# Patient Record
Sex: Female | Born: 2000 | Race: White | Hispanic: No | Marital: Single | Smoking: Never smoker
Health system: Southern US, Community
[De-identification: ages and names within clinical notes are randomized; demographics above are authoritative.]

## PROBLEM LIST (undated history)

## (undated) DIAGNOSIS — J45909 Unspecified asthma, uncomplicated: Secondary | ICD-10-CM

## (undated) DIAGNOSIS — K219 Gastro-esophageal reflux disease without esophagitis: Secondary | ICD-10-CM

## (undated) DIAGNOSIS — D649 Anemia, unspecified: Secondary | ICD-10-CM

## (undated) DIAGNOSIS — K589 Irritable bowel syndrome without diarrhea: Secondary | ICD-10-CM

## (undated) HISTORY — PX: WISDOM TOOTH EXTRACTION: SHX21

## (undated) HISTORY — PX: CHOLECYSTECTOMY: SHX55

---
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
## 2019-12-21 ENCOUNTER — Telehealth: Payer: Self-pay | Admitting: Advanced Practice Midwife

## 2019-12-21 NOTE — Telephone Encounter (Signed)

## 2019-12-22 ENCOUNTER — Encounter: Payer: Self-pay | Admitting: Advanced Practice Midwife

## 2019-12-22 ENCOUNTER — Other Ambulatory Visit: Payer: Self-pay

## 2019-12-22 ENCOUNTER — Ambulatory Visit: Payer: Managed Care, Other (non HMO) | Admitting: Advanced Practice Midwife

## 2019-12-22 VITALS — BP 137/86 | HR 113 | Ht 60.0 in | Wt 167.0 lb

## 2019-12-22 DIAGNOSIS — Z3009 Encounter for other general counseling and advice on contraception: Secondary | ICD-10-CM

## 2019-12-22 DIAGNOSIS — Z3202 Encounter for pregnancy test, result negative: Secondary | ICD-10-CM | POA: Diagnosis not present

## 2019-12-22 LAB — POCT URINE PREGNANCY: Preg Test, Ur: NEGATIVE

## 2019-12-22 NOTE — Progress Notes (Signed)
**Note Regina-Identified via Obfuscation**    GYN VISIT Patient name: Regina Jordan MRN [id number]  Date of birth: Aug 19, 2001 Chief Complaint:   discuss birth control options (interested in IUD)  History of Present Illness:   Regina Jordan is a 19 y.o. G0P0000 Caucasian female being seen today for discussion re contraception, esp interested in IUD. Unsure which one, but may want to try to conceive in 5 or less years.  Is very faithful with the pill but would like something more effective; on cycle currently and has not missed pills.  Patient's last menstrual period was 12/19/2019. The current method of family planning is OCP (estrogen/progesterone).  Last pap <20yo.  Review of Systems:   Pertinent items are noted in HPI Denies fever/chills, dizziness, headaches, visual disturbances, fatigue, shortness of breath, chest pain, abdominal pain, vomiting, abnormal vaginal discharge/itching/odor/irritation, problems with periods, bowel movements, urination, or intercourse unless otherwise stated above.  Pertinent History Reviewed:  Reviewed past medical,surgical, social, obstetrical and family history.  Reviewed problem list, medications and allergies. Physical Assessment:   Vitals:   12/22/19 1340  BP: 137/86  Pulse: (!) 113  Weight: 167 lb (75.8 kg)  Height: 5' (1.524 m)  Body mass index is 32.61 kg/m.       Physical Examination:   General appearance: alert, well appearing, and in no distress  Mental status: alert, oriented to person, place, and time  Skin: warm & dry   Cardiovascular: normal heart rate noted  Respiratory: normal respiratory effort, no distress  Abdomen: soft, non-tender   Pelvic: examination not indicated  Extremities: no edema    Results for orders placed or performed in visit on 12/22/19 (from the past 24 hour(s))  POCT urine pregnancy   Collection Time: 12/22/19  1:48 PM  Result Value Ref Range   Preg Test, Ur Negative Negative    Assessment & Plan:  1) Desires IUD placement> is on cycle and has  been taking pills regularly; debating between [place]- information given   Meds: No orders of the defined types were placed in this encounter.   Orders Placed This Encounter  Procedures  . POCT urine pregnancy    Return for tomorrow, IUD insertion.  Arabella Merles CNM 12/22/2019 2:15 PM

## 2019-12-22 NOTE — Patient Instructions (Signed)

## 2019-12-23 ENCOUNTER — Encounter: Payer: Self-pay | Admitting: Advanced Practice Midwife

## 2019-12-23 ENCOUNTER — Ambulatory Visit (INDEPENDENT_AMBULATORY_CARE_PROVIDER_SITE_OTHER): Payer: Managed Care, Other (non HMO) | Admitting: Advanced Practice Midwife

## 2019-12-23 ENCOUNTER — Other Ambulatory Visit: Payer: Self-pay

## 2019-12-23 VITALS — BP 122/85 | HR 97 | Ht 60.0 in | Wt 166.0 lb

## 2019-12-23 DIAGNOSIS — Z30430 Encounter for insertion of intrauterine contraceptive device: Secondary | ICD-10-CM

## 2019-12-23 DIAGNOSIS — Z3202 Encounter for pregnancy test, result negative: Secondary | ICD-10-CM | POA: Diagnosis not present

## 2019-12-23 MED ORDER — LEVONORGESTREL 20 MCG/24HR IU IUD: INTRAUTERINE_SYSTEM | Freq: Once | INTRAUTERINE | Status: AC

## 2019-12-23 NOTE — Addendum Note (Signed)
Addended by: Colen Darling on: 12/23/2019 04:35 PM   Modules accepted: Orders

## 2019-12-23 NOTE — Progress Notes (Signed)
Regina Jordan is a 19 y.o. year old  female   who presents for placement of a Mirena IUD. Her LMP was now (on COCs) and her pregnancy test today is negative.    The risks and benefits of the method and placement have been thouroughly reviewed with the patient and all questions were answered.  Specifically the patient is aware of failure rate of 10/998, expulsion of the IUD and of possible perforation.  The patient is aware of irregular bleeding due to the method and understands the incidence of irregular bleeding diminishes with time.  Time out was performed.  A Graves speculum was placed.  The cervix was prepped using Betadine. The uterus was found to be neutral,sl retroverted and it sounded to 7 cm.  The cervix was grasped with a tenaculum and the IUD was inserted to 7 cm.  It was pulled back 1 cm and the IUD was disengaged.  The strings were trimmed to 3 cm.  Sonogram was performed and the proper placement of the IUD was verified.  The patient was instructed on signs and symptoms of infection and to check for the strings after each menses or each month.  The patient is to refrain from intercourse for 3 days.  The patient is scheduled for a return appointment after her first menses or 4 weeks.  [name] 12/23/2019 4:05 PM

## 2019-12-23 NOTE — Patient Instructions (Signed)
IUD PLACEMENT POST-PROCEDURE INSTRUCTIONS  1. You may take Ibuprofen, Aleve or Tylenol for pain if needed.  Cramping should resolve within in 24 hours.  2. You may have a small amount of spotting.  You should wear a mini pad for the next few days  3.    Nothing in the vagina for 3 days (tampons or intercourse)  4.  You need to call if you have a fever (more than 100.4), any moderate to severe pelvic pain, fever, or foul smelling vaginal discharge.  Irregular bleeding is common the first several months after having an IUD placed. You do not need to call for this reason unless you are concerned.  5. Shower or bathe as normal  6.  You should have a follow-up appointment in 4-8 weeks for a re-check to make sure you are not having any problems.  

## 2020-01-19 ENCOUNTER — Ambulatory Visit (INDEPENDENT_AMBULATORY_CARE_PROVIDER_SITE_OTHER): Payer: Managed Care, Other (non HMO) | Admitting: Advanced Practice Midwife

## 2020-01-19 ENCOUNTER — Other Ambulatory Visit: Payer: Self-pay

## 2020-01-19 VITALS — Ht 60.0 in | Wt 169.0 lb

## 2020-01-19 DIAGNOSIS — Z30431 Encounter for routine checking of intrauterine contraceptive device: Secondary | ICD-10-CM

## 2020-01-19 MED ORDER — IBUPROFEN 800 MG PO TABS: 800.0000 mg | ORAL_TABLET | Freq: Three times a day (TID) | ORAL | 2 refills | Status: AC

## 2020-01-19 NOTE — Progress Notes (Signed)
   GYN VISIT Patient name: Regina Jordan MRN [id number]  Date of birth: [birth year] Chief Complaint:   Follow-up (IUD Check)  History of Present Illness:   Regina Jordan is a 19 y.o. G0P0000 Caucasian female being seen today for f/u of IUD. She had a Mirena placed on March 18th and reports having intermittent sharp discomfort as well as sharp pain once during sex (and had to stop) and then once after sex. Reports pink d/c but no bleeding since placement.    Depression screen [facility] 2/9 12/22/2019  Decreased Interest 0  Down, Depressed, Hopeless 0  PHQ - 2 Score 0    No LMP recorded. (Menstrual status: IUD). The current method of family planning is IUD.  Last pap <20yo.  Review of Systems:   Pertinent items are noted in HPI Denies fever/chills, dizziness, headaches, visual disturbances, fatigue, shortness of breath, chest pain, abdominal pain, vomiting, abnormal vaginal discharge/itching/odor/irritation, problems with periods, bowel movements, urination, or intercourse unless otherwise stated above.  Pertinent History Reviewed:  Reviewed past medical,surgical, social, obstetrical and family history.  Reviewed problem list, medications and allergies. Physical Assessment:   Vitals:   01/19/20 1428  Weight: 169 lb (76.7 kg)  Height: 5' (1.524 m)  Body mass index is 33.01 kg/m.       Physical Examination:   General appearance: alert, well appearing, and in no distress  Mental status: alert, oriented to person, place, and time  Skin: warm & dry   Cardiovascular: normal heart rate noted  Respiratory: normal respiratory effort, no distress  Abdomen: soft, non-tender   Pelvic: normal external genitalia, vulva, vagina, cervix, uterus and adnexa; string visualized approx 3cm from os; bedside u/s confirmed position of IUD in fundus  Extremities: no edema   Chaperone: Amanda Rash    No results found for this or any previous visit (from the past 24 hour(s)).  Assessment & Plan:  1) Mirena  IUD f/u> having intermittent sharp discomfort- wanted to ensure IUD was still in place; will try continuous Motrin for one week; discussed various positions for intercourse that may help with discomfort   Meds:  Meds ordered this encounter  Medications  . ibuprofen (ADVIL) 800 MG tablet    Sig: Take 1 tablet (800 mg total) by mouth every 8 (eight) hours for 7 days.    Dispense:  24 tablet    Refill:  2    Order Specific Question:   Supervising Provider    Answer:   Tilda Burrow [[id number]]    No orders of the defined types were placed in this encounter.   Return for prn.  Arabella Merles CNM 01/19/2020 3:00 PM

## 2020-01-20 ENCOUNTER — Ambulatory Visit: Payer: Managed Care, Other (non HMO) | Admitting: Advanced Practice Midwife

## 2020-11-10 ENCOUNTER — Ambulatory Visit: Payer: Managed Care, Other (non HMO) | Admitting: Women's Health

## 2020-11-17 ENCOUNTER — Ambulatory Visit: Payer: Self-pay | Admitting: Surgery

## 2020-11-17 NOTE — H&P (View-Only) (Signed)
Surgical Evaluation  Chief Complaint: multiple GI issues  HPI: this is a previously healthy 20 year old woman who has been experiencing significant GI issues since about November of last year.  She endorses postprandial right upper quadrant pain, intermittent nausea and emesis, as well as alternating diarrhea and constipation.  She also has a long-standing history of acid reflux for the last 3 or 4 years refractory to multiple medications. She has lost 30 pounds in the last few months due to the symptoms. She has had lab workearlier this week which was unremarkable including thyroid studies, lipase, hypertensive metabolic panel, CBC, UA, She underwent an ultrasound that demonstrated a 2 cm gallstone.    Denies any family history of inflammatory bowel disease or pancreatitis although several family members have had gallbladder disease.  No Known Allergies  Past Medical History:  Diagnosis Date  . Asthma     No past surgical history on file.  Family History  Problem Relation Age of Onset  . Hypertension Paternal Grandfather   . Hypertension Paternal Grandmother   . Thyroid disease Paternal Grandmother   . Other Maternal Grandmother        fatty liver  . Hypertension Father   . High Cholesterol Father   . ADD / ADHD Father   . Asthma Father   . ADD / ADHD Sister   . ADD / ADHD Sister     Social History   Socioeconomic History  . Marital status: Single    Spouse name: Not on file  . Number of children: Not on file  . Years of education: Not on file  . Highest education level: Not on file  Occupational History  . Not on file  Tobacco Use  . Smoking status: Never Smoker  . Smokeless tobacco: Never Used  Vaping Use  . Vaping Use: Never used  Substance and Sexual Activity  . Alcohol use: Not Currently  . Drug use: Never  . Sexual activity: Yes    Birth control/protection: Pill  Other Topics Concern  . Not on file  Social History Narrative  . Not on file   Social  Determinants of Health   Financial Resource Strain: Not on file  Food Insecurity: Not on file  Transportation Needs: Not on file  Physical Activity: Not on file  Stress: Not on file  Social Connections: Not on file    Current Outpatient Medications on File Prior to Visit  Medication Sig Dispense Refill  . albuterol (VENTOLIN HFA) 108 (90 Base) MCG/ACT inhaler Inhale into the lungs.    . cetirizine (ZYRTEC) 10 MG tablet Take by mouth.    . diphenhydrAMINE (BENADRYL) 25 MG tablet Take 1 tablet (25 mg total) by mouth every 8 (eight) hours as needed for itching or allergies. 20 tablet 0  . Norgestimate-Ethinyl Estradiol Triphasic 0.18/0.215/0.25 MG-35 MCG tablet TAKE 1 TABLET BY MOUTH DAILY-needs 3 packs per visit    . omeprazole (PRILOSEC) 40 MG capsule Take by mouth.    . triamcinolone ointment (KENALOG) 0.1 % ataa bid     No current facility-administered medications on file prior to visit.    Review of Systems: a complete, 10pt review of systems was completed with pertinent positives and negatives as documented in the HPI  Physical Exam: Vitals Weight: 139.5 lb Height: 62in Body Surface Area: 1.64 m Body Mass Index: 25.51 kg/m  Temp.: 98.48F  Pulse: 72 (Regular)  P.OX: 99% (Room air) BP: 120/60(Sitting, Left Arm, Standard)  She is alert and well-appearing Unlabored respirations Abdomen  is soft, nondistended, mildly tender in the right subcostal region and epigastrium, no hernia, no mass, no organomegaly  CBC Latest Ref Rng & Units 02/12/2017  WBC 4.5 - 13.5 K/uL 19.6(H)  Hemoglobin 12.0 - 16.0 g/dL 13.8  Hematocrit 36.0 - 49.0 % 41.6  Platelets 150 - 400 K/uL 324    CMP Latest Ref Rng & Units 02/12/2017  Glucose 65 - 99 mg/dL 81  BUN 6 - 20 mg/dL 6  Creatinine 0.50 - 1.00 mg/dL 0.83  Sodium 135 - 145 mmol/L 138  Potassium 3.5 - 5.1 mmol/L 3.2(L)  Chloride 101 - 111 mmol/L 104  CO2 22 - 32 mmol/L 22  Calcium 8.9 - 10.3 mg/dL 9.4  Total Protein 6.5 - 8.1 g/dL  8.0  Total Bilirubin 0.3 - 1.2 mg/dL 0.8  Alkaline Phos 47 - 119 U/L 72  AST 15 - 41 U/L 15  ALT 14 - 54 U/L 8(L)    No results found for: INR, PROTIME  Imaging: No results found.   A/P: biliary colic, multiple GI complaints. I recommend proceeding with laparoscopic/robotic cholecystectomy with possible cholangiogram. Discussedthe relevant anatomy using a diagram to demonstrate, as well as the technique and risks of surgery including bleeding, pain, scarring, intraabdominal injury specifically to the common bile duct and sequelae, bile leak, conversion to open surgery, blood clot, pneumonia, heart attack, stroke, failure to resolve symptoms, etc. Questions welcomed and answered. Also advised dietary modification to attempt a more bland, unprocessed diet (reduce restaurant foods and processed foods) and increasing water intake to gauge side effect of this on her bowel movements and reflux. If she has ongoing GI symptoms despite cholecystectomy and dietary modification, she will benefit from evaluation by gastroenterologist. She wishes to pursue gallbladder surgery.     Klaudia Beirne, MD [facility]  See AMION to contact appropriate on-call provider   

## 2020-11-17 NOTE — H&P (Signed)
Surgical Evaluation  Chief Complaint: multiple GI issues  HPI: this is a previously healthy 20 year old woman who has been experiencing significant GI issues since about November of last year.  She endorses postprandial right upper quadrant pain, intermittent nausea and emesis, as well as alternating diarrhea and constipation.  She also has a long-standing history of acid reflux for the last 3 or 4 years refractory to multiple medications. She has lost 30 pounds in the last few months due to the symptoms. She has had lab workearlier this week which was unremarkable including thyroid studies, lipase, hypertensive metabolic panel, CBC, UA, She underwent an ultrasound that demonstrated a 2 cm gallstone.    Denies any family history of inflammatory bowel disease or pancreatitis although several family members have had gallbladder disease.  No Known Allergies  Past Medical History:  Diagnosis Date  . Asthma     No past surgical history on file.  Family History  Problem Relation Age of Onset  . Hypertension Paternal Grandfather   . Hypertension Paternal Grandmother   . Thyroid disease Paternal Grandmother   . Other Maternal Grandmother        fatty liver  . Hypertension Father   . High Cholesterol Father   . ADD / ADHD Father   . Asthma Father   . ADD / ADHD Sister   . ADD / ADHD Sister     Social History   Socioeconomic History  . Marital status: Single    Spouse name: Not on file  . Number of children: Not on file  . Years of education: Not on file  . Highest education level: Not on file  Occupational History  . Not on file  Tobacco Use  . Smoking status: Never Smoker  . Smokeless tobacco: Never Used  Vaping Use  . Vaping Use: Never used  Substance and Sexual Activity  . Alcohol use: Not Currently  . Drug use: Never  . Sexual activity: Yes    Birth control/protection: Pill  Other Topics Concern  . Not on file  Social History Narrative  . Not on file   Social  Determinants of Health   Financial Resource Strain: Not on file  Food Insecurity: Not on file  Transportation Needs: Not on file  Physical Activity: Not on file  Stress: Not on file  Social Connections: Not on file    Current Outpatient Medications on File Prior to Visit  Medication Sig Dispense Refill  . albuterol (VENTOLIN HFA) 108 (90 Base) MCG/ACT inhaler Inhale into the lungs.    . cetirizine (ZYRTEC) 10 MG tablet Take by mouth.    . diphenhydrAMINE (BENADRYL) 25 MG tablet Take 1 tablet (25 mg total) by mouth every 8 (eight) hours as needed for itching or allergies. 20 tablet 0  . Norgestimate-Ethinyl Estradiol Triphasic 0.18/0.215/0.25 MG-35 MCG tablet TAKE 1 TABLET BY MOUTH DAILY-needs 3 packs per visit    . omeprazole (PRILOSEC) 40 MG capsule Take by mouth.    . triamcinolone ointment (KENALOG) 0.1 % ataa bid     No current facility-administered medications on file prior to visit.    Review of Systems: a complete, 10pt review of systems was completed with pertinent positives and negatives as documented in the HPI  Physical Exam: Vitals Weight: 139.5 lb Height: 62in Body Surface Area: 1.64 m Body Mass Index: 25.51 kg/m  Temp.: 98.48F  Pulse: 72 (Regular)  P.OX: 99% (Room air) BP: 120/60(Sitting, Left Arm, Standard)  She is alert and well-appearing Unlabored respirations Abdomen  is soft, nondistended, mildly tender in the right subcostal region and epigastrium, no hernia, no mass, no organomegaly  CBC Latest Ref Rng & Units 02/12/2017  WBC 4.5 - 13.5 K/uL 19.6(H)  Hemoglobin 12.0 - 16.0 g/dL 68.1  Hematocrit 27.5 - 49.0 % 41.6  Platelets 150 - 400 K/uL 324    CMP Latest Ref Rng & Units 02/12/2017  Glucose 65 - 99 mg/dL 81  BUN 6 - 20 mg/dL 6  Creatinine 1.70 - 0.17 mg/dL 4.94  Sodium 496 - 759 mmol/L 138  Potassium 3.5 - 5.1 mmol/L 3.2(L)  Chloride 101 - 111 mmol/L 104  CO2 22 - 32 mmol/L 22  Calcium 8.9 - 10.3 mg/dL 9.4  Total Protein 6.5 - 8.1 g/dL  8.0  Total Bilirubin 0.3 - 1.2 mg/dL 0.8  Alkaline Phos 47 - 119 U/L 72  AST 15 - 41 U/L 15  ALT 14 - 54 U/L 8(L)    No results found for: INR, PROTIME  Imaging: No results found.   A/P: biliary colic, multiple GI complaints. I recommend proceeding with laparoscopic/robotic cholecystectomy with possible cholangiogram. Discussedthe relevant anatomy using a diagram to demonstrate, as well as the technique and risks of surgery including bleeding, pain, scarring, intraabdominal injury specifically to the common bile duct and sequelae, bile leak, conversion to open surgery, blood clot, pneumonia, heart attack, stroke, failure to resolve symptoms, etc. Questions welcomed and answered. Also advised dietary modification to attempt a more bland, unprocessed diet (reduce restaurant foods and processed foods) and increasing water intake to gauge side effect of this on her bowel movements and reflux. If she has ongoing GI symptoms despite cholecystectomy and dietary modification, she will benefit from evaluation by gastroenterologist. She wishes to pursue gallbladder surgery.     Phylliss Blakes, MD [facility] [place]  See AMION to contact appropriate on-call provider

## 2020-11-23 ENCOUNTER — Encounter (HOSPITAL_COMMUNITY): Payer: Self-pay

## 2020-11-23 NOTE — Progress Notes (Signed)
DUE TO COVID-19 ONLY ONE VISITOR IS ALLOWED TO COME WITH YOU AND STAY IN THE WAITING ROOM ONLY DURING PRE OP AND PROCEDURE DAY OF SURGERY. THE 1 VISITOR  MAY VISIT WITH YOU AFTER SURGERY IN YOUR PRIVATE ROOM DURING VISITING HOURS ONLY!  YOU NEED TO HAVE A COVID 19 TEST ON__2/25/2022 ___ @_______ , THIS TEST MUST BE DONE BEFORE SURGERY,  COVID TESTING SITE [address] [place] , IT IS ON THE RIGHT GOING OUT [address] APPROXIMATELY  2 MINUTES PAST [employer] ON THE RIGHT. ONCE YOUR COVID TEST IS COMPLETED,  PLEASE BEGIN THE QUARANTINE INSTRUCTIONS AS OUTLINED IN YOUR HANDOUT.                Regina Jordan  11/23/2020   Your procedure is scheduled on: 12/05/2020   Report to [facility] Main  Entrance   Report to admitting at    1115AM      Call this number if you have problems the morning of surgery [phone]    Remember: Do not eat food , candy gum or mints :After Midnight. You may have clear liquids from midnight until     CLEAR LIQUID DIET   Foods Allowed                                                                       Coffee and tea, regular and decaf                              Plain Jell-O any favor except red or purple                                            Fruit ices (not with fruit pulp)                                      Iced Popsicles                                     Carbonated beverages, regular and diet                                    Cranberry, grape and apple juices Sports drinks like Gatorade Lightly seasoned clear broth or consume(fat free) Sugar, honey syrup   _____________________________________________________________________    BRUSH YOUR TEETH MORNING OF SURGERY AND RINSE YOUR MOUTH OUT, NO CHEWING GUM CANDY OR MINTS.     Take these medicines the morning of surgery with A SIP OF WATER:  iNHALERS AS USUAL AND BRING, PRILOSEC  DO NOT TAKE ANY DIABETIC MEDICATIONS DAY OF YOUR SURGERY                                You may not have any  metal on your body including hair pins and              piercings  Do not wear jewelry, make-up, lotions, powders or perfumes, deodorant             Do not wear nail polish on your fingernails.  Do not shave  48 hours prior to surgery.              Men may shave face and neck.   Do not bring valuables to the hospital. [place].  Contacts, dentures or bridgework may not be worn into surgery.  Leave suitcase in the car. After surgery it may be brought to your room.     Patients discharged the day of surgery will not be allowed to drive home. IF YOU ARE HAVING SURGERY AND GOING HOME THE SAME DAY, YOU MUST HAVE AN ADULT TO DRIVE YOU HOME AND BE WITH YOU FOR 24 HOURS. YOU MAY GO HOME BY TAXI OR UBER OR ORTHERWISE, BUT AN ADULT MUST ACCOMPANY YOU HOME AND STAY WITH YOU FOR 24 HOURS.  Name and phone number of your driver:  Special Instructions: N/A              Please read over the following fact sheets you were given: _____________________________________________________________________  [facility] - Preparing for Surgery Before surgery, you can play an important role.  Because skin is not sterile, your skin needs to be as free of germs as possible.  You can reduce the number of germs on your skin by washing with CHG (chlorahexidine gluconate) soap before surgery.  CHG is an antiseptic cleaner which kills germs and bonds with the skin to continue killing germs even after washing. Please DO NOT use if you have an allergy to CHG or antibacterial soaps.  If your skin becomes reddened/irritated stop using the CHG and inform your nurse when you arrive at Short Stay. Do not shave (including legs and underarms) for at least 48 hours prior to the first CHG shower.  You may shave your face/neck. Please follow these instructions carefully:  1.  Shower with CHG Soap the night before surgery and the  morning of  Surgery.  2.  If you choose to wash your hair, wash your hair first as usual with your  normal  shampoo.  3.  After you shampoo, rinse your hair and body thoroughly to remove the  shampoo.                           4.  Use CHG as you would any other liquid soap.  You can apply chg directly  to the skin and wash                       Gently with a scrungie or clean washcloth.  5.  Apply the CHG Soap to your body ONLY FROM THE NECK DOWN.   Do not use on face/ open                           Wound or open sores. Avoid contact with eyes, ears mouth and genitals (private parts).                       Wash face,  Genitals (private parts) with your normal soap.             6.  Wash thoroughly, paying special attention to the area where your surgery  will be performed.  7.  Thoroughly rinse your body with warm water from the neck down.  8.  DO NOT shower/wash with your normal soap after using and rinsing off  the CHG Soap.                9.  Pat yourself dry with a clean towel.            10.  Wear clean pajamas.            11.  Place clean sheets on your bed the night of your first shower and do not  sleep with pets. Day of Surgery : Do not apply any lotions/deodorants the morning of surgery.  Please wear clean clothes to the hospital/surgery center.  FAILURE TO FOLLOW THESE INSTRUCTIONS MAY RESULT IN THE CANCELLATION OF YOUR SURGERY PATIENT SIGNATURE_________________________________  NURSE SIGNATURE__________________________________  ________________________________________________________________________

## 2020-11-28 ENCOUNTER — Other Ambulatory Visit: Payer: Self-pay

## 2020-11-28 ENCOUNTER — Encounter (INDEPENDENT_AMBULATORY_CARE_PROVIDER_SITE_OTHER): Payer: Self-pay

## 2020-11-28 ENCOUNTER — Encounter (HOSPITAL_COMMUNITY)
Admission: RE | Admit: 2020-11-28 | Discharge: 2020-11-28 | Disposition: A | Discharge status: Home / Self Care | Payer: 59 | Source: Ambulatory Visit | Attending: Surgery | Admitting: Surgery

## 2020-11-28 ENCOUNTER — Encounter (HOSPITAL_COMMUNITY): Payer: Self-pay

## 2020-11-28 DIAGNOSIS — Z01812 Encounter for preprocedural laboratory examination: Secondary | ICD-10-CM | POA: Diagnosis not present

## 2020-11-28 HISTORY — DX: Gastro-esophageal reflux disease without esophagitis: K21.9

## 2020-11-28 HISTORY — DX: Anemia, unspecified: D64.9

## 2020-11-28 LAB — CBC
HCT: 40.5 % (ref 36.0–46.0)
Hemoglobin: 13.3 g/dL (ref 12.0–15.0)
MCH: 30.4 pg (ref 26.0–34.0)
MCHC: 32.8 g/dL (ref 30.0–36.0)
MCV: 92.7 fL (ref 80.0–100.0)
Platelets: 311 10*3/uL (ref 150–400)
RBC: 4.37 MIL/uL (ref 3.87–5.11)
RDW: 12.2 % (ref 11.5–15.5)
WBC: 7.8 10*3/uL (ref 4.0–10.5)
nRBC: 0 % (ref 0.0–0.2)

## 2020-12-02 ENCOUNTER — Other Ambulatory Visit (HOSPITAL_COMMUNITY): Payer: 59

## 2020-12-04 MED ORDER — BUPIVACAINE LIPOSOME 1.3 % IJ SUSP
20.0000 mL | Freq: Once | INTRAMUSCULAR | Status: DC
  Filled 2020-12-04: qty 20

## 2020-12-05 ENCOUNTER — Ambulatory Visit (HOSPITAL_COMMUNITY): Payer: 59 | Admitting: Certified Registered Nurse Anesthetist

## 2020-12-05 ENCOUNTER — Encounter (HOSPITAL_COMMUNITY): Payer: Self-pay | Admitting: Surgery

## 2020-12-05 ENCOUNTER — Observation Stay (HOSPITAL_COMMUNITY)
Admission: RE | Admit: 2020-12-05 | Discharge: 2020-12-06 | Disposition: A | Discharge status: Home / Self Care | Payer: 59 | Source: Ambulatory Visit | Attending: Surgery | Admitting: Surgery

## 2020-12-05 ENCOUNTER — Encounter (HOSPITAL_COMMUNITY)
Admission: RE | Disposition: A | Discharge status: Home / Self Care | Payer: Self-pay | Source: Ambulatory Visit | Attending: Surgery

## 2020-12-05 ENCOUNTER — Other Ambulatory Visit: Payer: Self-pay

## 2020-12-05 DIAGNOSIS — R112 Nausea with vomiting, unspecified: Secondary | ICD-10-CM

## 2020-12-05 DIAGNOSIS — J45909 Unspecified asthma, uncomplicated: Secondary | ICD-10-CM | POA: Insufficient documentation

## 2020-12-05 DIAGNOSIS — K8050 Calculus of bile duct without cholangitis or cholecystitis without obstruction: Principal | ICD-10-CM | POA: Insufficient documentation

## 2020-12-05 DIAGNOSIS — Z9049 Acquired absence of other specified parts of digestive tract: Secondary | ICD-10-CM

## 2020-12-05 DIAGNOSIS — Z9889 Other specified postprocedural states: Secondary | ICD-10-CM

## 2020-12-05 DIAGNOSIS — R1011 Right upper quadrant pain: Secondary | ICD-10-CM | POA: Diagnosis present

## 2020-12-05 HISTORY — DX: Nausea with vomiting, unspecified: R11.2

## 2020-12-05 HISTORY — DX: Other specified postprocedural states: Z98.890

## 2020-12-05 LAB — PREGNANCY, URINE: Preg Test, Ur: NEGATIVE

## 2020-12-05 SURGERY — CHOLECYSTECTOMY, ROBOT-ASSISTED, LAPAROSCOPIC
Anesthesia: General | Site: Abdomen

## 2020-12-05 MED ORDER — SODIUM CHLORIDE 0.9% FLUSH: 3.0000 mL | INTRAVENOUS | Status: DC | PRN

## 2020-12-05 MED ORDER — METOCLOPRAMIDE HCL 5 MG/ML IJ SOLN
INTRAMUSCULAR | Status: AC
  Filled 2020-12-05: qty 2

## 2020-12-05 MED ORDER — ORAL CARE MOUTH RINSE: 15.0000 mL | Freq: Once | OROMUCOSAL | Status: DC

## 2020-12-05 MED ORDER — AMISULPRIDE (ANTIEMETIC) 5 MG/2ML IV SOLN
10.0000 mg | Freq: Once | INTRAVENOUS | Status: AC
  Administered 2020-12-05: 10 mg via INTRAVENOUS

## 2020-12-05 MED ORDER — FENTANYL CITRATE (PF) 100 MCG/2ML IJ SOLN
INTRAMUSCULAR | Status: DC | PRN
  Administered 2020-12-05 (×4): 50 ug via INTRAVENOUS

## 2020-12-05 MED ORDER — LACTATED RINGERS IV SOLN: INTRAVENOUS | Status: DC

## 2020-12-05 MED ORDER — TRAMADOL HCL 50 MG PO TABS: 50.0000 mg | ORAL_TABLET | Freq: Four times a day (QID) | ORAL | 0 refills | Status: AC | PRN

## 2020-12-05 MED ORDER — FENTANYL CITRATE (PF) 100 MCG/2ML IJ SOLN
INTRAMUSCULAR | Status: AC
  Filled 2020-12-05: qty 2

## 2020-12-05 MED ORDER — SIMETHICONE 80 MG PO CHEW
40.0000 mg | CHEWABLE_TABLET | Freq: Four times a day (QID) | ORAL | Status: DC | PRN
  Filled 2020-12-05: qty 1

## 2020-12-05 MED ORDER — CHLORHEXIDINE GLUCONATE 0.12 % MT SOLN: 15.0000 mL | Freq: Once | OROMUCOSAL | Status: DC

## 2020-12-05 MED ORDER — CEFAZOLIN SODIUM-DEXTROSE 2-4 GM/100ML-% IV SOLN
2.0000 g | INTRAVENOUS | Status: AC
  Administered 2020-12-05: 2 g via INTRAVENOUS
  Filled 2020-12-05: qty 100

## 2020-12-05 MED ORDER — ACETAMINOPHEN 325 MG PO TABS: 325.0000 mg | ORAL_TABLET | Freq: Once | ORAL | Status: DC | PRN

## 2020-12-05 MED ORDER — CHLORHEXIDINE GLUCONATE 4 % EX LIQD: 60.0000 mL | Freq: Once | CUTANEOUS | Status: DC

## 2020-12-05 MED ORDER — BUPIVACAINE LIPOSOME 1.3 % IJ SUSP
INTRAMUSCULAR | Status: DC | PRN
  Administered 2020-12-05: 20 mL

## 2020-12-05 MED ORDER — METOCLOPRAMIDE HCL 5 MG/ML IJ SOLN
10.0000 mg | Freq: Once | INTRAMUSCULAR | Status: AC
  Administered 2020-12-05: 10 mg via INTRAVENOUS

## 2020-12-05 MED ORDER — LIDOCAINE 2% (20 MG/ML) 5 ML SYRINGE
INTRAMUSCULAR | Status: DC | PRN
  Administered 2020-12-05: 40 mg via INTRAVENOUS

## 2020-12-05 MED ORDER — MIDAZOLAM HCL 2 MG/2ML IJ SOLN
INTRAMUSCULAR | Status: AC
  Filled 2020-12-05: qty 2

## 2020-12-05 MED ORDER — BUPIVACAINE-EPINEPHRINE 0.25% -1:200000 IJ SOLN
INTRAMUSCULAR | Status: DC | PRN
  Administered 2020-12-05: 30 mL

## 2020-12-05 MED ORDER — MORPHINE SULFATE (PF) 4 MG/ML IV SOLN
1.0000 mg | INTRAVENOUS | Status: DC | PRN
Start: 2020-12-05 — End: 2020-12-06
  Administered 2020-12-06 (×2): 1 mg via INTRAVENOUS
  Filled 2020-12-05 (×2): qty 1

## 2020-12-05 MED ORDER — OXYCODONE HCL 5 MG PO TABS: 5.0000 mg | ORAL_TABLET | ORAL | Status: DC | PRN

## 2020-12-05 MED ORDER — ENOXAPARIN SODIUM 40 MG/0.4ML ~~LOC~~ SOLN: 40.0000 mg | SUBCUTANEOUS | Status: DC

## 2020-12-05 MED ORDER — AMISULPRIDE (ANTIEMETIC) 5 MG/2ML IV SOLN
INTRAVENOUS | Status: AC
  Filled 2020-12-05: qty 2

## 2020-12-05 MED ORDER — ONDANSETRON HCL 4 MG/2ML IJ SOLN
INTRAMUSCULAR | Status: AC
  Filled 2020-12-05: qty 2

## 2020-12-05 MED ORDER — GABAPENTIN 300 MG PO CAPS
300.0000 mg | ORAL_CAPSULE | ORAL | Status: AC
  Administered 2020-12-05: 300 mg via ORAL
  Filled 2020-12-05: qty 1

## 2020-12-05 MED ORDER — FENTANYL CITRATE (PF) 100 MCG/2ML IJ SOLN: 25.0000 ug | INTRAMUSCULAR | Status: DC | PRN

## 2020-12-05 MED ORDER — SCOPOLAMINE 1 MG/3DAYS TD PT72
MEDICATED_PATCH | TRANSDERMAL | Status: DC | PRN
  Administered 2020-12-05: 1 via TRANSDERMAL

## 2020-12-05 MED ORDER — DEXMEDETOMIDINE (PRECEDEX) IN NS 20 MCG/5ML (4 MCG/ML) IV SYRINGE
PREFILLED_SYRINGE | INTRAVENOUS | Status: DC | PRN
  Administered 2020-12-05 (×2): 4 ug via INTRAVENOUS

## 2020-12-05 MED ORDER — CHLORHEXIDINE GLUCONATE 0.12 % MT SOLN
15.0000 mL | Freq: Once | OROMUCOSAL | Status: AC
  Administered 2020-12-05: 12:00:00 15 mL via OROMUCOSAL

## 2020-12-05 MED ORDER — SODIUM CHLORIDE 0.9 % IV SOLN: 250.0000 mL | INTRAVENOUS | Status: DC | PRN

## 2020-12-05 MED ORDER — ROCURONIUM BROMIDE 10 MG/ML (PF) SYRINGE
PREFILLED_SYRINGE | INTRAVENOUS | Status: AC
  Filled 2020-12-05: qty 10

## 2020-12-05 MED ORDER — ONDANSETRON HCL 4 MG/2ML IJ SOLN
4.0000 mg | Freq: Four times a day (QID) | INTRAMUSCULAR | Status: DC | PRN
  Administered 2020-12-06 (×2): 4 mg via INTRAVENOUS
  Filled 2020-12-05 (×2): qty 2

## 2020-12-05 MED ORDER — BOOST / RESOURCE BREEZE PO LIQD CUSTOM
1.0000 | Freq: Three times a day (TID) | ORAL | Status: DC
  Administered 2020-12-06: 09:00:00 1 via ORAL

## 2020-12-05 MED ORDER — LIDOCAINE HCL (PF) 2 % IJ SOLN
INTRAMUSCULAR | Status: AC
  Filled 2020-12-05: qty 5

## 2020-12-05 MED ORDER — PROPOFOL 10 MG/ML IV BOLUS
INTRAVENOUS | Status: DC | PRN
  Administered 2020-12-05: 150 mg via INTRAVENOUS

## 2020-12-05 MED ORDER — DEXAMETHASONE SODIUM PHOSPHATE 10 MG/ML IJ SOLN
INTRAMUSCULAR | Status: AC
  Filled 2020-12-05: qty 1

## 2020-12-05 MED ORDER — KETOROLAC TROMETHAMINE 15 MG/ML IJ SOLN
15.0000 mg | Freq: Four times a day (QID) | INTRAMUSCULAR | Status: DC | PRN
  Administered 2020-12-05 – 2020-12-06 (×2): 15 mg via INTRAVENOUS
  Filled 2020-12-05 (×2): qty 1

## 2020-12-05 MED ORDER — TRAMADOL HCL 50 MG PO TABS: 50.0000 mg | ORAL_TABLET | Freq: Four times a day (QID) | ORAL | 0 refills | Status: DC | PRN

## 2020-12-05 MED ORDER — ONDANSETRON HCL 4 MG/2ML IJ SOLN
INTRAMUSCULAR | Status: DC | PRN
  Administered 2020-12-05: 4 mg via INTRAVENOUS

## 2020-12-05 MED ORDER — ACETAMINOPHEN 325 MG PO TABS
650.0000 mg | ORAL_TABLET | Freq: Four times a day (QID) | ORAL | Status: DC | PRN
  Administered 2020-12-06: 650 mg via ORAL
  Filled 2020-12-05: qty 2

## 2020-12-05 MED ORDER — SODIUM CHLORIDE 0.9% FLUSH: 3.0000 mL | Freq: Two times a day (BID) | INTRAVENOUS | Status: DC

## 2020-12-05 MED ORDER — SCOPOLAMINE 1 MG/3DAYS TD PT72
MEDICATED_PATCH | TRANSDERMAL | Status: AC
  Filled 2020-12-05: qty 1

## 2020-12-05 MED ORDER — ACETAMINOPHEN 500 MG PO TABS
1000.0000 mg | ORAL_TABLET | ORAL | Status: AC
  Administered 2020-12-05: 1000 mg via ORAL
  Filled 2020-12-05: qty 2

## 2020-12-05 MED ORDER — FENTANYL CITRATE (PF) 100 MCG/2ML IJ SOLN
25.0000 ug | INTRAMUSCULAR | Status: DC | PRN
  Administered 2020-12-05 (×2): 50 ug via INTRAVENOUS

## 2020-12-05 MED ORDER — MEPERIDINE HCL 50 MG/ML IJ SOLN: 6.2500 mg | INTRAMUSCULAR | Status: DC | PRN

## 2020-12-05 MED ORDER — ACETAMINOPHEN 160 MG/5ML PO SOLN: 325.0000 mg | Freq: Once | ORAL | Status: DC | PRN

## 2020-12-05 MED ORDER — ONDANSETRON 4 MG PO TBDP: 4.0000 mg | ORAL_TABLET | Freq: Four times a day (QID) | ORAL | Status: DC | PRN

## 2020-12-05 MED ORDER — ONDANSETRON HCL 4 MG/2ML IJ SOLN
4.0000 mg | Freq: Once | INTRAMUSCULAR | Status: AC
  Administered 2020-12-05: 4 mg via INTRAVENOUS

## 2020-12-05 MED ORDER — ROCURONIUM BROMIDE 10 MG/ML (PF) SYRINGE
PREFILLED_SYRINGE | INTRAVENOUS | Status: DC | PRN
  Administered 2020-12-05: 50 mg via INTRAVENOUS

## 2020-12-05 MED ORDER — DEXAMETHASONE SODIUM PHOSPHATE 10 MG/ML IJ SOLN
INTRAMUSCULAR | Status: DC | PRN
  Administered 2020-12-05: 4 mg via INTRAVENOUS

## 2020-12-05 MED ORDER — MIDAZOLAM HCL 2 MG/2ML IJ SOLN
INTRAMUSCULAR | Status: DC | PRN
  Administered 2020-12-05: 2 mg via INTRAVENOUS

## 2020-12-05 MED ORDER — INDOCYANINE GREEN 25 MG IV SOLR
2.5000 mg | Freq: Once | INTRAVENOUS | Status: DC
  Filled 2020-12-05: qty 10

## 2020-12-05 MED ORDER — INDOCYANINE GREEN 25 MG IV SOLR
2.5000 mg | Freq: Once | INTRAVENOUS | Status: AC
  Administered 2020-12-05: 2.5 mg via INTRAVENOUS
  Filled 2020-12-05: qty 1

## 2020-12-05 MED ORDER — ACETAMINOPHEN 650 MG RE SUPP
650.0000 mg | Freq: Four times a day (QID) | RECTAL | Status: DC | PRN
  Filled 2020-12-05: qty 1

## 2020-12-05 MED ORDER — SUGAMMADEX SODIUM 200 MG/2ML IV SOLN
INTRAVENOUS | Status: DC | PRN
  Administered 2020-12-05: 140 mg via INTRAVENOUS

## 2020-12-05 MED ORDER — PROMETHAZINE HCL 25 MG/ML IJ SOLN
INTRAMUSCULAR | Status: AC
  Filled 2020-12-05: qty 1

## 2020-12-05 MED ORDER — ORAL CARE MOUTH RINSE: 15.0000 mL | Freq: Once | OROMUCOSAL | Status: AC

## 2020-12-05 MED ORDER — ACETAMINOPHEN 10 MG/ML IV SOLN: 1000.0000 mg | Freq: Once | INTRAVENOUS | Status: DC | PRN

## 2020-12-05 MED ORDER — AMISULPRIDE (ANTIEMETIC) 5 MG/2ML IV SOLN
INTRAVENOUS | Status: AC
  Filled 2020-12-05: qty 4

## 2020-12-05 MED ORDER — PROPOFOL 10 MG/ML IV BOLUS
INTRAVENOUS | Status: AC
  Filled 2020-12-05: qty 20

## 2020-12-05 MED ORDER — BUPIVACAINE-EPINEPHRINE (PF) 0.25% -1:200000 IJ SOLN
INTRAMUSCULAR | Status: AC
  Filled 2020-12-05: qty 30

## 2020-12-05 MED ORDER — AMISULPRIDE (ANTIEMETIC) 5 MG/2ML IV SOLN
10.0000 mg | Freq: Once | INTRAVENOUS | Status: AC | PRN
  Administered 2020-12-05: 10 mg via INTRAVENOUS

## 2020-12-05 MED ORDER — PROMETHAZINE HCL 25 MG/ML IJ SOLN
6.2500 mg | INTRAMUSCULAR | Status: DC | PRN
  Administered 2020-12-05: 6.25 mg via INTRAVENOUS

## 2020-12-05 MED ORDER — SODIUM CHLORIDE 0.9 % IV SOLN: INTRAVENOUS | Status: DC

## 2020-12-05 MED ORDER — 0.9 % SODIUM CHLORIDE (POUR BTL) OPTIME
TOPICAL | Status: DC | PRN
  Administered 2020-12-05: 1000 mL

## 2020-12-05 MED ORDER — ACETAMINOPHEN 650 MG RE SUPP
650.0000 mg | RECTAL | Status: DC | PRN
  Filled 2020-12-05: qty 1

## 2020-12-05 MED ORDER — ACETAMINOPHEN 325 MG PO TABS: 650.0000 mg | ORAL_TABLET | ORAL | Status: DC | PRN

## 2020-12-05 SURGICAL SUPPLY — 50 items
ADH SKN CLS APL DERMABOND .7 (GAUZE/BANDAGES/DRESSINGS) ×1
APL PRP STRL LF DISP 70% ISPRP (MISCELLANEOUS) ×1
APPLIER CLIP 5 13 M/L LIGAMAX5 (MISCELLANEOUS)
APR CLP MED LRG 5 ANG JAW (MISCELLANEOUS)
BLADE SURG SZ11 CARB STEEL (BLADE) ×2 IMPLANT
CHLORAPREP W/TINT 26 (MISCELLANEOUS) ×2 IMPLANT
CLIP APPLIE 5 13 M/L LIGAMAX5 (MISCELLANEOUS) IMPLANT
CLIP VESOLOCK LG 6/CT PURPLE (CLIP) ×2 IMPLANT
COVER TIP SHEARS 8 DVNC (MISCELLANEOUS) ×1 IMPLANT
COVER TIP SHEARS 8MM DA VINCI (MISCELLANEOUS) ×2
COVER WAND RF STERILE (DRAPES) ×2 IMPLANT
DECANTER SPIKE VIAL GLASS SM (MISCELLANEOUS) ×2 IMPLANT
DERMABOND ADVANCED (GAUZE/BANDAGES/DRESSINGS) ×1
DERMABOND ADVANCED .7 DNX12 (GAUZE/BANDAGES/DRESSINGS) ×1 IMPLANT
DRAPE ARM DVNC X/XI (DISPOSABLE) ×4 IMPLANT
DRAPE COLUMN DVNC XI (DISPOSABLE) ×1 IMPLANT
DRAPE DA VINCI XI ARM (DISPOSABLE) ×8
DRAPE DA VINCI XI COLUMN (DISPOSABLE) ×2
ELECT REM PT RETURN 15FT ADLT (MISCELLANEOUS) ×2 IMPLANT
GLOVE SURG ENC MOIS LTX SZ6 (GLOVE) ×4 IMPLANT
GLOVE SURG UNDER LTX SZ6.5 (GLOVE) ×4 IMPLANT
GOWN STRL REUS W/TWL LRG LVL3 (GOWN DISPOSABLE) ×4 IMPLANT
GOWN STRL REUS W/TWL XL LVL3 (GOWN DISPOSABLE) IMPLANT
GRASPER SUT TROCAR 14GX15 (MISCELLANEOUS) ×2 IMPLANT
KIT BASIN OR (CUSTOM PROCEDURE TRAY) ×2 IMPLANT
KIT PROCEDURE DA VINCI SI (MISCELLANEOUS)
KIT PROCEDURE DVNC SI (MISCELLANEOUS) IMPLANT
KIT TURNOVER KIT A (KITS) ×2 IMPLANT
MANIFOLD NEPTUNE II (INSTRUMENTS) ×2 IMPLANT
NEEDLE HYPO 22GX1.5 SAFETY (NEEDLE) ×2 IMPLANT
NEEDLE INSUFFLATION 14GA 120MM (NEEDLE) ×2 IMPLANT
PACK CARDIOVASCULAR III (CUSTOM PROCEDURE TRAY) ×2 IMPLANT
SEAL CANN UNIV 5-8 DVNC XI (MISCELLANEOUS) ×4 IMPLANT
SEAL XI 5MM-8MM UNIVERSAL (MISCELLANEOUS) ×8
SEALER VESSEL DA VINCI XI (MISCELLANEOUS)
SEALER VESSEL EXT DVNC XI (MISCELLANEOUS) IMPLANT
SET IRRIG TUBING LAPAROSCOPIC (IRRIGATION / IRRIGATOR) IMPLANT
SOL ANTI FOG 6CC (MISCELLANEOUS) ×1 IMPLANT
SOLUTION ANTI FOG 6CC (MISCELLANEOUS) ×1
SOLUTION ELECTROLUBE (MISCELLANEOUS) ×2 IMPLANT
SPONGE LAP 18X18 RF (DISPOSABLE) ×2 IMPLANT
SUT MNCRL AB 4-0 PS2 18 (SUTURE) ×2 IMPLANT
SUT VIC AB 0 UR5 27 (SUTURE) ×2 IMPLANT
SYR CONTROL 10ML LL (SYRINGE) ×2 IMPLANT
SYS RETRIEVAL 5MM INZII UNIV (BASKET) ×2
SYSTEM RETRIEVL 5MM INZII UNIV (BASKET) ×1 IMPLANT
TOWEL OR 17X26 10 PK STRL BLUE (TOWEL DISPOSABLE) ×2 IMPLANT
TOWEL OR NON WOVEN STRL DISP B (DISPOSABLE) ×2 IMPLANT
TRAY FOLEY MTR SLVR 16FR STAT (SET/KITS/TRAYS/PACK) IMPLANT
TUBING INSUFFLATION 10FT LAP (TUBING) ×2 IMPLANT

## 2020-12-05 NOTE — Transfer of Care (Signed)
Immediate Anesthesia Transfer of Care Note  Patient: Regina Jordan  Procedure(s) Performed: XI ROBOTIC ASSISTED LAPAROSCOPIC CHOLECYSTECTOMY (N/A Abdomen)  Patient Location: PACU  Anesthesia Type:General  Level of Consciousness: awake, alert  and oriented  Airway & Oxygen Therapy: Patient Spontanous Breathing and Patient connected to face mask oxygen  Post-op Assessment: Report given to RN, Post -op Vital signs reviewed and stable and Patient moving all extremities X 4  Post vital signs: Reviewed and stable  Last Vitals:  Vitals Value Taken Time  BP 139/95 12/05/20 1545  Temp    Pulse 98 12/05/20 1545  Resp 19 12/05/20 1545  SpO2 100 % 12/05/20 1545  Vitals shown include unvalidated device data.  Last Pain:  Vitals:   12/05/20 1136  TempSrc:   PainSc: 0-No pain         Complications: No complications documented.

## 2020-12-05 NOTE — Interval H&P Note (Signed)
History and Physical Interval Note:  12/05/2020 1:32 PM  Regina Jordan  has presented today for surgery, with the diagnosis of BILIARY COLIC.  The various methods of treatment have been discussed with the patient and family. After consideration of risks, benefits and other options for treatment, the patient has consented to  Procedure(s) with comments: XI ROBOTIC ASSISTED LAPAROSCOPIC CHOLECYSTECTOMY (N/A) - ROOM 2 STARTING AT 01:15PM FOR 90 MIN as a surgical intervention.  The patient's history has been reviewed, patient examined, no change in status, stable for surgery.  I have reviewed the patient's chart and labs.  Questions were answered to the patient's satisfaction.     Nitara Szczerba Lollie Sails

## 2020-12-05 NOTE — Discharge Instructions (Signed)
LAPAROSCOPIC SURGERY: POST OP INSTRUCTIONS   EAT Gradually transition to a high fiber diet with a fiber supplement over the next few weeks after discharge.  Start with a pureed / full liquid diet (see below)  WALK Walk an hour a day.  Control your pain to do that.    CONTROL PAIN Control pain so that you can walk, sleep, tolerate sneezing/coughing, go up/down stairs.  HAVE A BOWEL MOVEMENT DAILY Keep your bowels regular to avoid problems.  OK to try a laxative to override constipation.  OK to use an antidairrheal to slow down diarrhea.  Call if not better after 2 tries  CALL IF YOU HAVE PROBLEMS/CONCERNS Call if you are still struggling despite following these instructions. Call if you have concerns not answered by these instructions    1. DIET: Follow a light bland diet & liquids the first 24 hours after arrival home, such as soup, liquids, starches, etc.  Be sure to drink plenty of fluids.  Quickly advance to a usual solid diet within a few days.  Avoid fast food or heavy meals as your are more likely to get nauseated or have irregular bowels.  A low-sugar, high-fiber diet for the rest of your life is ideal.  2. Take your usually prescribed home medications unless otherwise directed.  3. PAIN CONTROL: a. Pain is best controlled by a usual combination of three different methods TOGETHER: i. Ice/Heat ii. Over the counter pain medication iii. Prescription pain medication b. Most patients will experience some swelling and bruising around the incisions.  Ice packs or heating pads (30-60 minutes up to 6 times a day) will help. Use ice for the first few days to help decrease swelling and bruising, then switch to heat to help relax tight/sore spots and speed recovery.  Some people prefer to use ice alone, heat alone, alternating between ice & heat.  Experiment to what works for you.  Swelling and bruising can take several weeks to resolve.   c. It is helpful to take an over-the-counter pain  medication regularly for the first few days: i. Naproxen (Aleve, etc)  Two 220mg tabs twice a day OR Ibuprofen (Advil, etc) Three 200mg tabs four times a day (every meal & bedtime) AND ii. Acetaminophen (Tylenol, etc) 500-650mg four times a day (every meal & bedtime) d. A  prescription for pain medication (such as oxycodone, hydrocodone, tramadol, gabapentin, methocarbamol, etc) should be given to you upon discharge.  Take your pain medication as prescribed, IF NEEDED.  i. If you are having problems/concerns with the prescription medicine (does not control pain, nausea, vomiting, rash, itching, etc), please call us [phone] to see if we need to switch you to a different pain medicine that will work better for you and/or control your side effect better. ii. If you need a refill on your pain medication, please give us 48 hour notice.  contact your pharmacy.  They will contact our office to request authorization. Prescriptions will not be filled after 5 pm or on week-ends  4. Avoid getting constipated.   a. Between the surgery and the pain medications, it is common to experience some constipation.   b. Increasing fluid intake and taking a fiber supplement (such as Metamucil, Citrucel, FiberCon, MiraLax, etc) 1-2 times a day regularly will usually help prevent this problem from occurring.   c. A mild laxative (prune juice, Milk of Magnesia, MiraLax, etc) should be taken according to package directions if there are no bowel movements after 48 hours.     5. Watch out for diarrhea.   a. If you have many loose bowel movements, simplify your diet to bland foods & liquids for a few days.   b. Stop any stool softeners and decrease your fiber supplement.   c. Switching to mild anti-diarrheal medications (Kayopectate, Pepto Bismol) can help.   d. If this worsens or does not improve, please call us.  6. Wash / shower every day.  You may shower over the skin glue which is waterproof  7. Glue will flake off  after about 2 weeks.  You may leave the incision open to air.  You may replace a dressing/Band-Aid to cover the incision for comfort if you wish.   8. ACTIVITIES as tolerated:   a. You may resume regular (light) daily activities beginning the next day--such as daily self-care, walking, climbing stairs--gradually increasing activities as tolerated.  If you can walk 30 minutes without difficulty, it is safe to try more intense activity such as jogging, treadmill, bicycling, low-impact aerobics, swimming, etc. b. Save the most intensive and strenuous activity for last such as sit-ups, heavy lifting, contact sports, etc  Refrain from any heavy lifting or straining until you are off narcotics for pain control.   c. DO NOT PUSH THROUGH PAIN.  Let pain be your guide: If it hurts to do something, don't do it.  Pain is your body warning you to avoid that activity for another week until the pain goes down. d. You may drive when you are no longer taking prescription pain medication, you can comfortably wear a seatbelt, and you can safely maneuver your car and apply brakes. e. You may have sexual intercourse when it is comfortable.  9. FOLLOW UP in our office a. Please call [facility] at [phone] to set up an appointment to see your surgeon in the office for a follow-up appointment approximately 2-3 weeks after your surgery. b. Make sure that you call for this appointment the day you arrive home to insure a convenient appointment time.  10. IF YOU HAVE DISABILITY OR FAMILY LEAVE FORMS, BRING THEM TO THE OFFICE FOR PROCESSING.  DO NOT GIVE THEM TO YOUR DOCTOR.   WHEN TO CALL US [phone]: 1. Poor pain control 2. Reactions / problems with new medications (rash/itching, nausea, etc)  3. Fever over 101.5 F (38.5 C) 4. Inability to urinate 5. Nausea and/or vomiting 6. Worsening swelling or bruising 7. Continued bleeding from incision. 8. Increased pain, redness, or drainage from the incision   The  clinic staff is available to answer your questions during regular business hours (8:30am-5pm).  Please don't hesitate to call and ask to speak to one of our nurses for clinical concerns.   If you have a medical emergency, go to the nearest emergency room or call 911.  A surgeon from [facility] is always on call at the hospitals   [facility] [address], [address], [place], [place]  [address] ? MAIN: [phone] ? TOLL FREE: [phone] ?  FAX [phone] [email or web address]   General Anesthesia, Adult, Care After This sheet gives you information about how to care for yourself after your procedure. Your health care provider may also give you more specific instructions. If you have problems or questions, contact your health care provider. What can I expect after the procedure? After the procedure, the following side effects are common:  Pain or discomfort at the IV site.  Nausea.  Vomiting.  Sore throat.  Trouble concentrating.    Feeling cold or chills.  Feeling weak or tired.  Sleepiness and fatigue.  Soreness and body aches. These side effects can affect parts of the body that were not involved in surgery. Follow these instructions at home: For the time period you were told by your health care provider:  Rest.  Do not participate in activities where you could fall or become injured.  Do not drive or use machinery.  Do not drink alcohol.  Do not take sleeping pills or medicines that cause drowsiness.  Do not make important decisions or sign legal documents.  Do not take care of children on your own.   Eating and drinking  Follow any instructions from your health care provider about eating or drinking restrictions.  When you feel hungry, start by eating small amounts of foods that are soft and easy to digest (bland), such as toast. Gradually return to your regular diet.  Drink enough fluid to keep your  urine pale yellow.  If you vomit, rehydrate by drinking water, juice, or clear broth. General instructions  If you have sleep apnea, surgery and certain medicines can increase your risk for breathing problems. Follow instructions from your health care provider about wearing your sleep device: ? Anytime you are sleeping, including during daytime naps. ? While taking prescription pain medicines, sleeping medicines, or medicines that make you drowsy.  Have a responsible adult stay with you for the time you are told. It is important to have someone help care for you until you are awake and alert.  Return to your normal activities as told by your health care provider. Ask your health care provider what activities are safe for you.  Take over-the-counter and prescription medicines only as told by your health care provider.  If you smoke, do not smoke without supervision.  Keep all follow-up visits as told by your health care provider. This is important. Contact a health care provider if:  You have nausea or vomiting that does not get better with medicine.  You cannot eat or drink without vomiting.  You have pain that does not get better with medicine.  You are unable to pass urine.  You develop a skin rash.  You have a fever.  You have redness around your IV site that gets worse. Get help right away if:  You have difficulty breathing.  You have chest pain.  You have blood in your urine or stool, or you vomit blood. Summary  After the procedure, it is common to have a sore throat or nausea. It is also common to feel tired.  Have a responsible adult stay with you for the time you are told. It is important to have someone help care for you until you are awake and alert.  When you feel hungry, start by eating small amounts of foods that are soft and easy to digest (bland), such as toast. Gradually return to your regular diet.  Drink enough fluid to keep your urine pale  yellow.  Return to your normal activities as told by your health care provider. Ask your health care provider what activities are safe for you. This information is not intended to replace advice given to you by your health care provider. Make sure you discuss any questions you have with your health care provider. Document Revised: 06/08/2020 Document Reviewed: 01/06/2020 Elsevier Patient Education  2021 [employer]   

## 2020-12-05 NOTE — Op Note (Signed)
Operative Note  Regina Jordan  [id number]  [id number]  12/05/2020   Surgeon: Leeroy Bock A ConnorMD  Procedure performed: robotic cholecystectomy   Preop diagnosis: biliary colic Post-op diagnosis/intraop findings: same  Specimens: gallbladder  Retained items: no EBL: minimal cc Complications: none  Description of procedure: After obtaining informed consent the patient was taken to the operating room and placed supine on operating room table wheregeneral endotracheal anesthesia was initiated, preoperative antibiotics were administered, SCDs applied, and a formal timeout was performed. The abdomen was prepped and draped in the usual sterile fashion and the abdomen was entered using a left subcostal veress needle after instilling the site with local. Insufflation to was obtained, a periumbilical 64mm trocar and camera inserted, and gross inspection revealed no evidence of injury from our entry or other intraabdominal abnormalities.  Bilateral laparoscopic assisted taps blocks were performed with Exparel mixed with quarter percent Marcaine.  Three additional 8 mm robotictrocars were introduced in the left and right midclavicular and right anterior axillary lines under direct visualization and following infiltration with local.  The robot was then docked and instruments inserted under direct visualization.    The gallbladder was retracted cephalad and the infundibulum was retracted laterally. A combination of hook electrocautery and blunt dissection was utilized to clear the peritoneum from the neck and cystic duct, circumferentially isolating the cystic artery and cystic duct and lifting the gallbladder from the cystic plate. The critical view of safety was achieved with the cystic artery, cystic duct, and liver bed visualized between them with no other structures.  This was concordant with the view on firefly.  The artery was diminutive and was divided with cautery. The cystic duct was  ligated with three clips on the proximal end and one on the gallbladder side and divided. The gallbladder was dissected from the liver plate using electrocautery.  Hemostasis along the liver bed was ensured with cautery and fenestrated bipolar energy as the dissection progressed. Once freed the gallbladder was placed in an endocatch bag and removed intact through the left upper quadrant trocar site.   Hemostasis was once again confirmed, and reinspection of the abdomen revealed no injuries. The clips were well opposed without any bile leak from the duct or the liver bed on direct visualization nor on firefly. The left upper quadrant extraction port site in the was closed with a 0 vicryl in the fascia under direct visualization using a PMI device. The abdomen was desufflated and all trocars removed. The skin incisions were closed with subcuticular monocryl and Dermabond. The patient was awakened, extubated and transported to the recovery room in stable condition.   All counts were correct at the completion of the case.

## 2020-12-05 NOTE — Progress Notes (Addendum)
Patient informed that surgeon is behind schedule.   Informed patient she will possibly go back to OR by 1430. Patient voiced understanding.

## 2020-12-05 NOTE — Anesthesia Procedure Notes (Signed)
Procedure Name: Intubation Date/Time: 12/05/2020 2:21 PM Performed by: Niel Hummer, CRNA Pre-anesthesia Checklist: Patient identified, Emergency Drugs available, Suction available and Patient being monitored Patient Re-evaluated:Patient Re-evaluated prior to induction Oxygen Delivery Method: Circle system utilized Preoxygenation: Pre-oxygenation with 100% oxygen Induction Type: IV induction Ventilation: Mask ventilation without difficulty Laryngoscope Size: Mac and 4 Grade View: Grade I Tube type: Oral Tube size: 7.0 mm Number of attempts: 1 Airway Equipment and Method: Stylet Placement Confirmation: ETT inserted through vocal cords under direct vision,  positive ETCO2 and breath sounds checked- equal and bilateral Secured at: 21 cm Tube secured with: Tape Dental Injury: Teeth and Oropharynx as per pre-operative assessment

## 2020-12-05 NOTE — Anesthesia Preprocedure Evaluation (Addendum)
Anesthesia Evaluation  Patient identified by MRN, date of birth, ID band Patient awake    Reviewed: Allergy & Precautions, NPO status , Patient's Chart, lab work & pertinent test results  Airway Mallampati: I  TM Distance: >3 FB Neck ROM: Full    Dental  (+) Teeth Intact, Dental Advisory Given   Pulmonary asthma ,    breath sounds clear to auscultation       Cardiovascular negative cardio ROS   Rhythm:Regular Rate:Normal     Neuro/Psych negative neurological ROS  negative psych ROS   GI/Hepatic Neg liver ROS, GERD  Medicated,  Endo/Other  negative endocrine ROS  Renal/GU negative Renal ROS     Musculoskeletal negative musculoskeletal ROS (+)   Abdominal Normal abdominal exam  (+)   Peds  Hematology negative hematology ROS (+)   Anesthesia Other Findings   Reproductive/Obstetrics                            Anesthesia Physical Anesthesia Plan  ASA: II  Anesthesia Plan: General   Post-op Pain Management:    Induction: Intravenous  PONV Risk Score and Plan: 4 or greater and Ondansetron, Dexamethasone, Midazolam and Scopolamine patch - Pre-op  Airway Management Planned: Oral ETT  Additional Equipment: None  Intra-op Plan:   Post-operative Plan: Extubation in OR  Informed Consent: I have reviewed the patients History and Physical, chart, labs and discussed the procedure including the risks, benefits and alternatives for the proposed anesthesia with the patient or authorized representative who has indicated his/her understanding and acceptance.     Dental advisory given  Plan Discussed with: CRNA  Anesthesia Plan Comments:        Anesthesia Quick Evaluation

## 2020-12-06 DIAGNOSIS — K8050 Calculus of bile duct without cholangitis or cholecystitis without obstruction: Secondary | ICD-10-CM | POA: Diagnosis not present

## 2020-12-06 LAB — CREATININE, SERUM
Creatinine, Ser: 0.58 mg/dL (ref 0.44–1.00)
GFR, Estimated: >60 mL/min (ref >60)

## 2020-12-06 MED ORDER — TRAMADOL HCL 50 MG PO TABS
50.0000 mg | ORAL_TABLET | Freq: Four times a day (QID) | ORAL | Status: DC | PRN
  Administered 2020-12-06: 09:00:00 50 mg via ORAL
  Filled 2020-12-06: qty 1

## 2020-12-06 MED ORDER — ACETAMINOPHEN 500 MG PO TABS
1000.0000 mg | ORAL_TABLET | Freq: Four times a day (QID) | ORAL | Status: DC
  Administered 2020-12-06: 1000 mg via ORAL
  Filled 2020-12-06: qty 2

## 2020-12-06 NOTE — Discharge Summary (Signed)
Physician Discharge Summary  Patient ID: Regina Jordan MRN: [id number] DOB/AGE: 2001/07/08 19 y.o.  Admit date: 12/05/2020 Discharge date: 12/06/2020  Admission Diagnoses:  Discharge Diagnoses:  Active Problems:   S/P laparoscopic cholecystectomy   Discharged Condition: good  Hospital Course: She underwent an uneventful robotic cholecystectomy.  Experienced postop nausea and vomiting requiring admission for supportive care and symptom relief.  On the morning of postop day 1 was tolerating liquids and nausea had significantly improved.  Complains of pain to the epigastrium, controlled with medication.  Has been walking the halls.  Vital signs normal. Stable for discharge.  Consults: None  Significant Diagnostic Studies: no  Treatments: Surgery as above, IV fluids, pain and nausea medication  Discharge Exam: Blood pressure 113/66, pulse (!) 57, temperature 98.2 F (36.8 C), temperature source Oral, resp. rate 17, height 5' (1.524 m), weight 61.2 kg, SpO2 99 %. General appearance: alert and cooperative Resp: Unlabored Cardio: regular rate and rhythm GI: Soft, nondistended, appropriate tender in the upper abdomen Extremities: extremities normal, atraumatic, no cyanosis or edema Incision/Wound: Incisions clean dry intact with Dermabond, no cellulitis or hematoma  Disposition: Discharge disposition: 01-Home or Self Care        Allergies as of 12/06/2020   No Known Allergies     Medication List    TAKE these medications   albuterol 108 (90 Base) MCG/ACT inhaler Commonly known as: VENTOLIN HFA Inhale 2 puffs into the lungs every 6 (six) hours as needed for wheezing or shortness of breath.   cholecalciferol 25 MCG (1000 UNIT) tablet Commonly known as: VITAMIN D3 Take 1,000 Units by mouth daily.   ferrous sulfate 325 (65 FE) MG tablet Take 325 mg by mouth daily with breakfast.   levonorgestrel 20 MCG/24HR IUD Commonly known as: MIRENA 1 each by Intrauterine route  once.   omeprazole 40 MG capsule Commonly known as: PRILOSEC Take 40 mg by mouth daily.   PROBIOTIC DAILY PO Take 1 capsule by mouth daily.   traMADol 50 MG tablet Commonly known as: Ultram Take 1 tablet (50 mg total) by mouth every 6 (six) hours as needed (Alternate Tylenol and ibuprofen around-the-clock for the first few days.  Take this medication as needed for pain not relieved by those medications, ice or rest.).   vitamin B-12 1000 MCG tablet Commonly known as: CYANOCOBALAMIN Take 1,000 mcg by mouth daily.       Follow-up Information    Berna Bue, MD In 3 weeks.   Specialty: General Surgery Contact information: [address] [place] [address] [phone]               Signed: Berna Bue 12/06/2020, 8:02 AM

## 2020-12-06 NOTE — Progress Notes (Signed)
Discharge instructions given to patient and all questions were answered.  

## 2020-12-06 NOTE — Anesthesia Postprocedure Evaluation (Signed)
Anesthesia Post Note  Patient: LEANDRA VANDERWEELE  Procedure(s) Performed: XI ROBOTIC ASSISTED LAPAROSCOPIC CHOLECYSTECTOMY (N/A Abdomen)     Patient location during evaluation: PACU Anesthesia Type: General Level of consciousness: awake and alert Pain management: pain level controlled Vital Signs Assessment: post-procedure vital signs reviewed and stable Respiratory status: spontaneous breathing, nonlabored ventilation, respiratory function stable and patient connected to nasal cannula oxygen Cardiovascular status: blood pressure returned to baseline and stable Postop Assessment: no apparent nausea or vomiting Anesthetic complications: no   No complications documented.                Shelton Silvas

## 2020-12-07 LAB — SURGICAL PATHOLOGY

## 2020-12-29 ENCOUNTER — Encounter: Payer: Self-pay | Admitting: Surgery

## 2022-03-20 ENCOUNTER — Encounter (HOSPITAL_BASED_OUTPATIENT_CLINIC_OR_DEPARTMENT_OTHER): Payer: Self-pay | Admitting: Emergency Medicine

## 2022-03-20 ENCOUNTER — Other Ambulatory Visit: Payer: Self-pay

## 2022-03-20 ENCOUNTER — Emergency Department (HOSPITAL_BASED_OUTPATIENT_CLINIC_OR_DEPARTMENT_OTHER)
Admission: EM | Admit: 2022-03-20 | Discharge: 2022-03-20 | Disposition: A | Discharge status: Home / Self Care | Payer: 59 | Attending: Emergency Medicine | Admitting: Emergency Medicine

## 2022-03-20 DIAGNOSIS — K529 Noninfective gastroenteritis and colitis, unspecified: Secondary | ICD-10-CM | POA: Diagnosis not present

## 2022-03-20 DIAGNOSIS — R1032 Left lower quadrant pain: Secondary | ICD-10-CM | POA: Diagnosis present

## 2022-03-20 HISTORY — DX: Irritable bowel syndrome, unspecified: K58.9

## 2022-03-20 LAB — CBC
HCT: 39.3 % (ref 36.0–46.0)
Hemoglobin: 13.3 g/dL (ref 12.0–15.0)
MCH: 31.1 pg (ref 26.0–34.0)
MCHC: 33.8 g/dL (ref 30.0–36.0)
MCV: 92 fL (ref 80.0–100.0)
Platelets: 270 10*3/uL (ref 150–400)
RBC: 4.27 MIL/uL (ref 3.87–5.11)
RDW: 12.1 % (ref 11.5–15.5)
WBC: 8.5 10*3/uL (ref 4.0–10.5)
nRBC: 0 % (ref 0.0–0.2)

## 2022-03-20 LAB — COMPREHENSIVE METABOLIC PANEL
ALT: <5 U/L (ref 0–44)
AST: 14 U/L — ABNORMAL LOW (ref 15–41)
Albumin: 4.4 g/dL (ref 3.5–5.0)
Alkaline Phosphatase: 53 U/L (ref 38–126)
Anion gap: 10 (ref 5–15)
BUN: 16 mg/dL (ref 6–20)
CO2: 23 mmol/L (ref 22–32)
Calcium: 9.5 mg/dL (ref 8.9–10.3)
Chloride: 104 mmol/L (ref 98–111)
Creatinine, Ser: 0.71 mg/dL (ref 0.44–1.00)
GFR, Estimated: >60 mL/min (ref >60)
Glucose, Bld: 97 mg/dL (ref 70–99)
Potassium: 3.6 mmol/L (ref 3.5–5.1)
Sodium: 137 mmol/L (ref 135–145)
Total Bilirubin: 0.4 mg/dL (ref 0.3–1.2)
Total Protein: 6.8 g/dL (ref 6.5–8.1)

## 2022-03-20 LAB — PREGNANCY, URINE: Preg Test, Ur: NEGATIVE

## 2022-03-20 LAB — URINALYSIS, ROUTINE W REFLEX MICROSCOPIC
Bilirubin Urine: NEGATIVE
Glucose, UA: NEGATIVE mg/dL
Hgb urine dipstick: NEGATIVE
Ketones, ur: NEGATIVE mg/dL
Leukocytes,Ua: NEGATIVE
Nitrite: NEGATIVE
Protein, ur: NEGATIVE mg/dL
Specific Gravity, Urine: 1.027 (ref 1.005–1.030)
pH: 6.5 (ref 5.0–8.0)

## 2022-03-20 LAB — ABO/RH: ABO/RH(D): O POS

## 2022-03-20 LAB — LIPASE, BLOOD: Lipase: 12 U/L (ref 11–51)

## 2022-03-20 MED ORDER — CIPROFLOXACIN HCL 500 MG PO TABS: 500.0000 mg | ORAL_TABLET | Freq: Two times a day (BID) | ORAL | 0 refills | Status: AC

## 2022-03-20 MED ORDER — CIPROFLOXACIN HCL 500 MG PO TABS
500.0000 mg | ORAL_TABLET | Freq: Once | ORAL | Status: AC
  Administered 2022-03-20: 500 mg via ORAL
  Filled 2022-03-20: qty 1

## 2022-03-20 MED ORDER — METRONIDAZOLE 500 MG PO TABS: 500.0000 mg | ORAL_TABLET | Freq: Three times a day (TID) | ORAL | 0 refills | Status: AC

## 2022-03-20 MED ORDER — METRONIDAZOLE 500 MG PO TABS
500.0000 mg | ORAL_TABLET | Freq: Once | ORAL | Status: AC
  Administered 2022-03-20: 500 mg via ORAL
  Filled 2022-03-20: qty 1

## 2022-03-20 NOTE — Discharge Instructions (Signed)
You are diagnosed with possible colitis, which is inflammation of your bowels.  You were started antibiotics which may relieve the symptoms you are experiencing.  Not all colitis, however, is infectious.  Therefore you will need to follow-up with gastroenterology if you continue having the symptoms.  If the blood in your stool worsens, you begin to feel lightheaded, nauseated, or have worsening abdominal pain, please come back to the ER immediately

## 2022-03-20 NOTE — ED Provider Notes (Signed)
[facility] EMERGENCY DEPT Provider Note   CSN: [id number] Arrival date & time: 03/20/22  1652     History  Chief Complaint  Patient presents with   Rectal Bleeding    Regina Jordan is a 21 y.o. female presenting to ED with abdominal cramping and blood in her stool.  She noticed this the past day or 2, has had loose bowel movements, which is a chronic issue as she has IBS, but noticed that she had a significant mount of bleeding which was dark in her stool.  She has some cramping left upper and left lower sided abdominal pain as well, has a history of hemorrhoids and noted bright red blood when she was wiping, but was concerned because she was dripping blood into the toilet.  HPI     Home Medications Prior to Admission medications   Medication Sig Start Date End Date Taking? Authorizing Provider  ciprofloxacin (CIPRO) 500 MG tablet Take 1 tablet (500 mg total) by mouth 2 (two) times daily for 7 days. 03/20/22 03/27/22 Yes Darnell Jeschke, Kermit Balo, MD  metroNIDAZOLE (FLAGYL) 500 MG tablet Take 1 tablet (500 mg total) by mouth 3 (three) times daily for 7 days. 03/20/22 03/27/22 Yes Shahiem Bedwell, Kermit Balo, MD  albuterol (VENTOLIN HFA) 108 (90 Base) MCG/ACT inhaler Inhale 2 puffs into the lungs every 6 (six) hours as needed for wheezing or shortness of breath. 10/21/18   [provider]  cholecalciferol (VITAMIN D3) 25 MCG (1000 UNIT) tablet Take 1,000 Units by mouth daily.    [provider]  ferrous sulfate 325 (65 FE) MG tablet Take 325 mg by mouth daily with breakfast.    [provider]  levonorgestrel (MIRENA) 20 MCG/24HR IUD 1 each by Intrauterine route once.    [provider]  omeprazole (PRILOSEC) 40 MG capsule Take 40 mg by mouth daily. 08/08/19   [provider]  Probiotic Product (PROBIOTIC DAILY PO) Take 1 capsule by mouth daily.    [provider]  vitamin B-12 (CYANOCOBALAMIN) 1000 MCG tablet Take 1,000 mcg by mouth daily.     [provider]      Allergies    Patient has no known allergies.    Review of Systems   Review of Systems  Physical Exam Updated Vital Signs BP 115/89 (BP Location: Right Arm)   Pulse 83   Temp 98.6 F (37 C) (Oral)   Resp 16   LMP 02/26/2022 (Exact Date)   SpO2 97%  Physical Exam Constitutional:      General: She is not in acute distress. HENT:     Head: Normocephalic and atraumatic.  Eyes:     Conjunctiva/sclera: Conjunctivae normal.     Pupils: Pupils are equal, round, and reactive to light.  Cardiovascular:     Rate and Rhythm: Normal rate and regular rhythm.  Pulmonary:     Effort: Pulmonary effort is normal. No respiratory distress.  Abdominal:     General: There is no distension.     Tenderness: There is abdominal tenderness in the left upper quadrant.  Skin:    General: Skin is warm and dry.  Neurological:     General: No focal deficit present.     Mental Status: She is alert. Mental status is at baseline.  Psychiatric:        Mood and Affect: Mood normal.        Behavior: Behavior normal.     ED Results / Procedures / Treatments   Labs (all  labs ordered are listed, but only abnormal results are displayed) Labs Reviewed  COMPREHENSIVE METABOLIC PANEL - Abnormal; Notable for the following components:      Result Value   AST 14 (*)    All other components within normal limits  CBC  PREGNANCY, URINE  LIPASE, BLOOD  URINALYSIS, ROUTINE W REFLEX MICROSCOPIC  ABO/RH    EKG None  Radiology No results found.  Procedures Procedures    Medications Ordered in ED Medications  ciprofloxacin (CIPRO) tablet 500 mg (500 mg Oral Given 03/20/22 2248)  metroNIDAZOLE (FLAGYL) tablet 500 mg (500 mg Oral Given 03/20/22 2248)    ED Course/ Medical Decision Making/ A&P                           Medical Decision Making Amount and/or Complexity of Data Reviewed Labs: ordered.  Risk Prescription drug management.   This patient presents to  the ED with concern for abdominal pain and bloody bowel movement. This involves an extensive number of treatment options, and is a complaint that carries with it a high risk of complications and morbidity.  The differential diagnosis includes colitis versus diverticulitis with bleeding hemorrhoid versus other  Doubt acute appendicitis clinically.  Co-morbidities that complicate the patient evaluation: History of IBS  Patient reports she follows with [facility] gastroenterology.  She reports he had a colonoscopy performed a few months ago and was told that "everything was normal, no sign of Crohn's".  I am not able to access the colonoscopy report  I ordered and personally interpreted labs.  The pertinent results include: No leukocytosis, CMP within normal  I ordered medication including Cipro and Flagyl for suspected  Test Considered: Patient does not have any focal guarding or findings on abdominal exam to suggest GI perforation or acute appendicitis.  Do not feel that an emergent CT scan was indicated at this time.  I think is reasonable to begin treatment for suspected colitis with ciprofloxacin and Flagyl.  Dispostion:  After consideration of the diagnostic results and the patients response to treatment, I feel that the patent would benefit from outpatient follow-up with her gastroenterologist.         Final Clinical Impression(s) / ED Diagnoses Final diagnoses:  Colitis    Rx / DC Orders ED Discharge Orders          Ordered    ciprofloxacin (CIPRO) 500 MG tablet  2 times daily        03/20/22 2239    metroNIDAZOLE (FLAGYL) 500 MG tablet  3 times daily        03/20/22 2239              Terald Sleeper, MD 03/20/22 2320

## 2022-03-20 NOTE — ED Triage Notes (Signed)
Pt reports abdominal pain and nausea starting today, with black stool following, and then afterwards dark red diarrhea  Pt reports h/o IBS and fatigue since Saturday.

## 2022-05-30 ENCOUNTER — Ambulatory Visit
Admission: RE | Admit: 2022-05-30 | Discharge: 2022-05-30 | Disposition: A | Discharge status: Home / Self Care | Payer: 59 | Source: Ambulatory Visit | Attending: Physician Assistant | Admitting: Physician Assistant

## 2022-05-30 ENCOUNTER — Other Ambulatory Visit: Payer: Self-pay | Admitting: Family Medicine

## 2022-05-30 ENCOUNTER — Other Ambulatory Visit: Payer: Self-pay | Admitting: Physician Assistant

## 2022-05-30 DIAGNOSIS — R1030 Lower abdominal pain, unspecified: Secondary | ICD-10-CM

## 2022-05-30 MED ORDER — IOPAMIDOL (ISOVUE-370) INJECTION 76%
75.0000 mL | Freq: Once | INTRAVENOUS | Status: AC | PRN
Start: 2022-05-30 — End: 2022-05-30
  Administered 2022-05-30: 75 mL via INTRAVENOUS

## 2022-05-30 NOTE — Progress Notes (Signed)
I evaluated the patient for a contrast reaction at [facility]. Shortly following a CT with IV Isovue-370, the patient developed itching with numerous hives on the extremities and trunk. She denied difficulty breathing, throat tightness, or other symptoms and was alert and oriented, speaking in complete sentences. Benadryl 50 mg IV was administered and the patient was monitored closely for 30+ minutes during which time the hives and itching were regressing. She was discharged under the care of her mother and instructed to seek immediate medical attention if any new symptoms should develop later.

## 2023-01-19 ENCOUNTER — Emergency Department (HOSPITAL_BASED_OUTPATIENT_CLINIC_OR_DEPARTMENT_OTHER)
Admission: EM | Admit: 2023-01-19 | Discharge: 2023-01-19 | Disposition: A | Discharge status: Home / Self Care | Payer: BC Managed Care – PPO | Attending: Emergency Medicine | Admitting: Emergency Medicine

## 2023-01-19 ENCOUNTER — Other Ambulatory Visit: Payer: Self-pay

## 2023-01-19 ENCOUNTER — Encounter (HOSPITAL_BASED_OUTPATIENT_CLINIC_OR_DEPARTMENT_OTHER): Payer: Self-pay | Admitting: Emergency Medicine

## 2023-01-19 DIAGNOSIS — R112 Nausea with vomiting, unspecified: Secondary | ICD-10-CM | POA: Insufficient documentation

## 2023-01-19 DIAGNOSIS — R1084 Generalized abdominal pain: Secondary | ICD-10-CM | POA: Insufficient documentation

## 2023-01-19 DIAGNOSIS — J45909 Unspecified asthma, uncomplicated: Secondary | ICD-10-CM | POA: Insufficient documentation

## 2023-01-19 DIAGNOSIS — R197 Diarrhea, unspecified: Secondary | ICD-10-CM | POA: Diagnosis not present

## 2023-01-19 DIAGNOSIS — R109 Unspecified abdominal pain: Secondary | ICD-10-CM

## 2023-01-19 DIAGNOSIS — R1010 Upper abdominal pain, unspecified: Secondary | ICD-10-CM | POA: Diagnosis present

## 2023-01-19 LAB — COMPREHENSIVE METABOLIC PANEL
ALT: 14 U/L (ref 0–44)
AST: 18 U/L (ref 15–41)
Albumin: 4.4 g/dL (ref 3.5–5.0)
Alkaline Phosphatase: 50 U/L (ref 38–126)
Anion gap: 11 (ref 5–15)
BUN: 15 mg/dL (ref 6–20)
CO2: 20 mmol/L — ABNORMAL LOW (ref 22–32)
Calcium: 8.9 mg/dL (ref 8.9–10.3)
Chloride: 106 mmol/L (ref 98–111)
Creatinine, Ser: 0.73 mg/dL (ref 0.44–1.00)
GFR, Estimated: >60 mL/min (ref >60)
Glucose, Bld: 130 mg/dL — ABNORMAL HIGH (ref 70–99)
Potassium: 3.5 mmol/L (ref 3.5–5.1)
Sodium: 137 mmol/L (ref 135–145)
Total Bilirubin: 0.9 mg/dL (ref 0.3–1.2)
Total Protein: 6.4 g/dL — ABNORMAL LOW (ref 6.5–8.1)

## 2023-01-19 LAB — URINALYSIS, ROUTINE W REFLEX MICROSCOPIC
Bilirubin Urine: NEGATIVE
Glucose, UA: NEGATIVE mg/dL
Hgb urine dipstick: NEGATIVE
Leukocytes,Ua: NEGATIVE
Nitrite: NEGATIVE
Protein, ur: 30 mg/dL — AB
Specific Gravity, Urine: 1.028 (ref 1.005–1.030)
pH: 6 (ref 5.0–8.0)

## 2023-01-19 LAB — CBC WITH DIFFERENTIAL/PLATELET
Abs Immature Granulocytes: 0.02 10*3/uL (ref 0.00–0.07)
Basophils Absolute: 0 10*3/uL (ref 0.0–0.1)
Basophils Relative: 0 %
Eosinophils Absolute: 0.2 10*3/uL (ref 0.0–0.5)
Eosinophils Relative: 2 %
HCT: 39.9 % (ref 36.0–46.0)
Hemoglobin: 14.3 g/dL (ref 12.0–15.0)
Immature Granulocytes: 0 %
Lymphocytes Relative: 4 %
Lymphs Abs: 0.4 10*3/uL — ABNORMAL LOW (ref 0.7–4.0)
MCH: 32 pg (ref 26.0–34.0)
MCHC: 35.8 g/dL (ref 30.0–36.0)
MCV: 89.3 fL (ref 80.0–100.0)
Monocytes Absolute: 0.2 10*3/uL (ref 0.1–1.0)
Monocytes Relative: 3 %
Neutro Abs: 8 10*3/uL — ABNORMAL HIGH (ref 1.7–7.7)
Neutrophils Relative %: 91 %
Platelets: 237 10*3/uL (ref 150–400)
RBC: 4.47 MIL/uL (ref 3.87–5.11)
RDW: 12 % (ref 11.5–15.5)
WBC: 8.8 10*3/uL (ref 4.0–10.5)
nRBC: 0 % (ref 0.0–0.2)

## 2023-01-19 LAB — PREGNANCY, URINE: Preg Test, Ur: NEGATIVE

## 2023-01-19 LAB — LIPASE, BLOOD: Lipase: <10 U/L — ABNORMAL LOW (ref 11–51)

## 2023-01-19 MED ORDER — ONDANSETRON 4 MG PO TBDP: 4.0000 mg | ORAL_TABLET | Freq: Three times a day (TID) | ORAL | 0 refills | Status: AC | PRN

## 2023-01-19 MED ORDER — MORPHINE SULFATE (PF) 4 MG/ML IV SOLN
4.0000 mg | Freq: Once | INTRAVENOUS | Status: AC
  Administered 2023-01-19: 4 mg via INTRAVENOUS
  Filled 2023-01-19: qty 1

## 2023-01-19 MED ORDER — OXYCODONE-ACETAMINOPHEN 5-325 MG PO TABS
1.0000 | ORAL_TABLET | Freq: Once | ORAL | Status: AC
  Administered 2023-01-19: 1 via ORAL
  Filled 2023-01-19: qty 1

## 2023-01-19 MED ORDER — ONDANSETRON HCL 4 MG/2ML IJ SOLN
4.0000 mg | Freq: Once | INTRAMUSCULAR | Status: AC
  Administered 2023-01-19: 4 mg via INTRAVENOUS
  Filled 2023-01-19: qty 2

## 2023-01-19 MED ORDER — SODIUM CHLORIDE 0.9 % IV BOLUS
1000.0000 mL | Freq: Once | INTRAVENOUS | Status: AC
  Administered 2023-01-19: 1000 mL via INTRAVENOUS

## 2023-01-19 MED ORDER — ONDANSETRON 4 MG PO TBDP
4.0000 mg | ORAL_TABLET | Freq: Once | ORAL | Status: AC
Start: 2023-01-19 — End: 2023-01-19
  Administered 2023-01-19: 4 mg via ORAL
  Filled 2023-01-19: qty 1

## 2023-01-19 MED ORDER — DICYCLOMINE HCL 10 MG PO CAPS
10.0000 mg | ORAL_CAPSULE | Freq: Once | ORAL | Status: AC
Start: 2023-01-19 — End: 2023-01-19
  Administered 2023-01-19: 10 mg via ORAL
  Filled 2023-01-19: qty 1

## 2023-01-19 MED ORDER — DICYCLOMINE HCL 20 MG PO TABS: 20.0000 mg | ORAL_TABLET | Freq: Two times a day (BID) | ORAL | 0 refills | Status: AC

## 2023-01-19 NOTE — Discharge Instructions (Addendum)
You were seen today for abdominal pain, vomiting and diarrhea.  Your lab work and workup is reassuring.  This is likely related to your known IBS and/or postviral gastritis.  Take Zofran as needed as well as Bentyl.  Maintain a clear liquid diet until symptoms are improving.

## 2023-01-19 NOTE — ED Provider Notes (Signed)
[place] EMERGENCY DEPARTMENT AT [facility] Provider Note   CSN: [id number] Arrival date & time: 01/19/23  4098     History {Add pertinent medical, surgical, social history, OB history to HPI:1} Chief Complaint  Patient presents with   Abdominal Pain   Vomiting   Diarrhea    Regina Jordan is a 22 y.o. female.  HPI     This is a 22 year old female with a history of IBS and cholecystectomy who presents with abdominal pain and nausea.  Patient reports that she woke up this morning with crampy upper abdominal pain.  She has had multiple episodes of nonbilious, none bloody emesis and diarrhea.  She has a history of cholecystectomy.  She has IBS with a predominance of diarrhea.  Of note, she states she had "the stomach bug" last weekend and since then has not "felt right."  Denies fevers.  Does not believe herself to be pregnant.  Denies urinary symptoms.  Home Medications Prior to Admission medications   Medication Sig Start Date End Date Taking? Authorizing Provider  albuterol (VENTOLIN HFA) 108 (90 Base) MCG/ACT inhaler Inhale 2 puffs into the lungs every 6 (six) hours as needed for wheezing or shortness of breath. 10/21/18   [provider]  cholecalciferol (VITAMIN D3) 25 MCG (1000 UNIT) tablet Take 1,000 Units by mouth daily.    [provider]  ferrous sulfate 325 (65 FE) MG tablet Take 325 mg by mouth daily with breakfast.    [provider]  levonorgestrel (MIRENA) 20 MCG/24HR IUD 1 each by Intrauterine route once.    [provider]  omeprazole (PRILOSEC) 40 MG capsule Take 40 mg by mouth daily. 08/08/19   [provider]  Probiotic Product (PROBIOTIC DAILY PO) Take 1 capsule by mouth daily.    [provider]  vitamin B-12 (CYANOCOBALAMIN) 1000 MCG tablet Take 1,000 mcg by mouth daily.    [provider]      Allergies    Iodinated contrast media and Other    Review of Systems   Review of Systems   Constitutional:  Negative for fever.  Respiratory:  Negative for shortness of breath.   Cardiovascular:  Negative for chest pain.  Gastrointestinal:  Positive for abdominal pain, diarrhea, nausea and vomiting.  All other systems reviewed and are negative.   Physical Exam Updated Vital Signs BP 113/76   Pulse 93   Temp 99 F (37.2 C) (Oral)   Resp 20   Ht 1.524 m (5')   Wt 59 kg   SpO2 99%   BMI 25.39 kg/m  Physical Exam Vitals and nursing note reviewed.  Constitutional:      Appearance: She is well-developed. She is not ill-appearing.  HENT:     Head: Normocephalic and atraumatic.  Eyes:     Pupils: Pupils are equal, round, and reactive to light.  Cardiovascular:     Rate and Rhythm: Normal rate and regular rhythm.     Heart sounds: Normal heart sounds.  Pulmonary:     Effort: Pulmonary effort is normal. No respiratory distress.     Breath sounds: No wheezing.  Abdominal:     General: Bowel sounds are normal.     Palpations: Abdomen is soft.     Tenderness: There is generalized abdominal tenderness. There is no guarding or rebound.  Musculoskeletal:     Cervical back: Neck supple.  Skin:    General: Skin is warm and dry.  Neurological:     Mental Status:  She is alert and oriented to person, place, and time.  Psychiatric:        Mood and Affect: Mood normal.     ED Results / Procedures / Treatments   Labs (all labs ordered are listed, but only abnormal results are displayed) Labs Reviewed  URINALYSIS, ROUTINE W REFLEX MICROSCOPIC - Abnormal; Notable for the following components:      Result Value   APPearance HAZY (*)    Ketones, ur TRACE (*)    Protein, ur 30 (*)    Bacteria, UA RARE (*)    All other components within normal limits  CBC WITH DIFFERENTIAL/PLATELET - Abnormal; Notable for the following components:   Neutro Abs 8.0 (*)    Lymphs Abs 0.4 (*)    All other components within normal limits  COMPREHENSIVE METABOLIC PANEL - Abnormal; Notable for  the following components:   CO2 20 (*)    Glucose, Bld 130 (*)    Total Protein 6.4 (*)    All other components within normal limits  LIPASE, BLOOD - Abnormal; Notable for the following components:   Lipase <10 (*)    All other components within normal limits  PREGNANCY, URINE    EKG None  Radiology No results found.  Procedures Procedures  {Document cardiac monitor, telemetry assessment procedure when appropriate:1}  Medications Ordered in ED Medications  dicyclomine (BENTYL) capsule 10 mg (has no administration in time range)  ondansetron (ZOFRAN-ODT) disintegrating tablet 4 mg (4 mg Oral Given 01/19/23 0549)  ondansetron (ZOFRAN) injection 4 mg (4 mg Intravenous Given 01/19/23 0609)  sodium chloride 0.9 % bolus 1,000 mL (1,000 mLs Intravenous New Bag/Given 01/19/23 0603)    ED Course/ Medical Decision Making/ A&P   {   Click here for ABCD2, HEART and other calculatorsREFRESH Note before signing :1}                          Medical Decision Making Amount and/or Complexity of Data Reviewed Labs: ordered.  Risk Prescription drug management.   ***  {Document critical care time when appropriate:1} {Document review of labs and clinical decision tools ie heart score, Chads2Vasc2 etc:1}  {Document your independent review of radiology images, and any outside records:1} {Document your discussion with family members, caretakers, and with consultants:1} {Document social determinants of health affecting pt's care:1} {Document your decision making why or why not admission, treatments were needed:1} Final Clinical Impression(s) / ED Diagnoses Final diagnoses:  None    Rx / DC Orders ED Discharge Orders     None

## 2023-01-19 NOTE — ED Notes (Signed)
Pt given water for PO challenge 

## 2023-01-19 NOTE — ED Triage Notes (Signed)
Pt to ED from home c/o upper abd pain that woke her up suddenly from sleep at 0200.  States nausea, vomiting, diarrhea about 4 times each.  Hx gallbladder removal.
